# Patient Record
Sex: Female | Born: 2000 | Race: Asian | Hispanic: No | Marital: Single | Smoking: Never smoker
Health system: Southern US, Community
[De-identification: ages and names within clinical notes are randomized; demographics above are authoritative.]

## PROBLEM LIST (undated history)

## (undated) DIAGNOSIS — F319 Bipolar disorder, unspecified: Secondary | ICD-10-CM

---
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
## 2024-07-14 ENCOUNTER — Ambulatory Visit: Admission: EM | Admit: 2024-07-14 | Discharge: 2024-07-14 | Disposition: A | Discharge status: Home / Self Care

## 2024-07-14 ENCOUNTER — Encounter: Payer: Self-pay | Admitting: *Deleted

## 2024-07-14 DIAGNOSIS — R197 Diarrhea, unspecified: Secondary | ICD-10-CM

## 2024-07-14 DIAGNOSIS — R112 Nausea with vomiting, unspecified: Secondary | ICD-10-CM

## 2024-07-14 HISTORY — DX: Bipolar disorder, unspecified: F31.9

## 2024-07-14 MED ORDER — LOPERAMIDE HCL 2 MG PO CAPS: 2.0000 mg | ORAL_CAPSULE | Freq: Four times a day (QID) | ORAL | 0 refills | Status: AC | PRN

## 2024-07-14 MED ORDER — ONDANSETRON 4 MG PO TBDP: 4.0000 mg | ORAL_TABLET | Freq: Three times a day (TID) | ORAL | 0 refills | Status: AC | PRN

## 2024-07-14 NOTE — ED Provider Notes (Signed)
 " [facility]    CSN: [id number] Arrival date & time: 07/14/24  1736      History   Chief Complaint Chief Complaint  Patient presents with   Emesis   Diarrhea    HPI Toni Watts is a 23 y.o. female presenting w viral syndrome.  Pt reports n/v/d, onset today. Mild epigastric pain. No meds tried. Last emesis was this morning, 2-3 episodes, only mild nausea persists. States having diarrhea throughout the day, estimates 2-3x an hour, denies bloody diarrhea, denies black or tarry stool. Denies known fever. States had stomach pain earlier but none now. She is tolerating fluids po.  Has not attempted interventions at home.  Pt suspects food poisoning from company function (pink chicken). Denies recent travel Denies urinary symptoms. No prior history abd procedures.   HPI  Past Medical History:  Diagnosis Date   Bipolar 1 disorder [facility])     Patient Active Problem List   Diagnosis Date Noted   Hashimoto's thyroiditis 01/29/2017   Thyroid  goiter 09/13/2016    No past surgical history on file.  OB History   No obstetric history on file.      Home Medications    Prior to Admission medications  Medication Sig Start Date End Date Taking? Authorizing Provider  ARIPiprazole (ABILIFY) 5 MG tablet Take 5 mg by mouth daily. 06/30/24  Yes [provider]  escitalopram (LEXAPRO) 20 MG tablet Take 20 mg by mouth daily.   Yes [provider]  loperamide  (IMODIUM ) 2 MG capsule Take 1 capsule (2 mg total) by mouth 4 (four) times daily as needed for diarrhea or loose stools. 07/14/24  Yes Nalu Troublefield E, PA-C  ondansetron  (ZOFRAN -ODT) 4 MG disintegrating tablet Take 1 tablet (4 mg total) by mouth every 8 (eight) hours as needed for nausea or vomiting. 07/14/24  Yes Arlyss Leita BRAVO, PA-C  Vitamin D, Ergocalciferol, (DRISDOL) 1.25 MG (50000 UNIT) CAPS capsule Take 50,000 Units by mouth every 7 (seven) days. 09/19/23  Yes [provider]    Family  History No family history on file.  Social History Social History[1]   Allergies   Patient has no known allergies.   Review of Systems Review of Systems  Constitutional:  Negative for appetite change, chills and fever.  HENT:  Negative for congestion, ear pain, rhinorrhea, sinus pressure, sinus pain and sore throat.   Eyes:  Negative for redness and visual disturbance.  Respiratory:  Negative for cough, chest tightness, shortness of breath and wheezing.   Cardiovascular:  Negative for chest pain and palpitations.  Gastrointestinal:  Positive for diarrhea, nausea and vomiting. Negative for abdominal pain and constipation.  Genitourinary:  Negative for dysuria, frequency and urgency.  Musculoskeletal:  Negative for myalgias.  Neurological:  Negative for dizziness, weakness and headaches.  Psychiatric/Behavioral:  Negative for confusion.   All other systems reviewed and are negative.    Physical Exam Triage Vital Signs ED Triage Vitals [07/14/24 1814]  Encounter Vitals Group     BP      Girls Systolic BP Percentile      Girls Diastolic BP Percentile      Boys Systolic BP Percentile      Boys Diastolic BP Percentile      Pulse      Resp      Temp      Temp src      SpO2      Weight      Height      Head  Circumference      Peak Flow      Pain Score 0     Pain Loc      Pain Education      Exclude from Growth Chart    No data found.  Updated Vital Signs BP 111/72 (BP Location: Right Arm)   Pulse (!) 103   Temp 99 F (37.2 C) (Oral)   Resp 18   LMP 07/12/2024 (Approximate)   SpO2 96%   Visual Acuity Right Eye Distance:   Left Eye Distance:   Bilateral Distance:    Right Eye Near:   Left Eye Near:    Bilateral Near:     Physical Exam Vitals reviewed.  Constitutional:      General: She is not in acute distress.    Appearance: Normal appearance. She is not ill-appearing.  HENT:     Head: Normocephalic and atraumatic.     Mouth/Throat:     Mouth:  Mucous membranes are moist.     Comments: Moist mucous membranes Eyes:     Extraocular Movements: Extraocular movements intact.     Pupils: Pupils are equal, round, and reactive to light.  Cardiovascular:     Rate and Rhythm: Normal rate and regular rhythm.     Heart sounds: Normal heart sounds.  Pulmonary:     Effort: Pulmonary effort is normal.     Breath sounds: Normal breath sounds. No wheezing, rhonchi or rales.  Abdominal:     General: Bowel sounds are increased. There is no distension.     Palpations: Abdomen is soft. There is no mass.     Tenderness: There is abdominal tenderness in the epigastric area. There is no right CVA tenderness, left CVA tenderness, guarding or rebound. Negative signs include Murphy's sign, Rovsing's sign and McBurney's sign.     Comments: BS increased throughout. Mild epigastric pain to palpation without guarding or rebound.  Skin:    General: Skin is warm.     Capillary Refill: Capillary refill takes less than 2 seconds.     Comments: Good skin turgor  Neurological:     General: No focal deficit present.     Mental Status: She is alert and oriented to person, place, and time.  Psychiatric:        Mood and Affect: Mood normal.        Behavior: Behavior normal.      UC Treatments / Results  Labs (all labs ordered are listed, but only abnormal results are displayed) Labs Reviewed - No data to display  EKG   Radiology No results found.  Procedures Procedures (including critical care time)  Medications Ordered in UC Medications - No data to display  Initial Impression / Assessment and Plan / UC Course  I have reviewed the triage vital signs and the nursing notes.  Pertinent labs & imaging results that were available during my care of the patient were reviewed by me and considered in my medical decision making (see chart for details).     Patient is a pleasant 23 y.o. female presenting with nausea, vomiting, and diarrhea.  Suspect  food poisoning.. The patient is afebrile and nontachycardic.  Antipyretic has not been administered today. LMP 07/12/24.  She is tolerating fluids by mouth.  Zofran  sent.  Imodium  sent.  Good hydration, BRAT diet.  Return precautions as below.  Final Clinical Impressions(s) / UC Diagnoses   Final diagnoses:  Nausea vomiting and diarrhea     Discharge Instructions      -  Take the Zofran  (ondansetron ) up to 3 times daily for nausea and vomiting. Dissolve one pill under your tongue or between your teeth and your cheek. -You can purchase Imodium  (loperamide ) over-the-counter. Take the Imodium  (loperamide ) up to 4 times daily for diarrhea. -Drink plenty of fluids and eat a bland diet as tolerated -Follow-up if new/worsening symptoms, including worsening abdominal pain, new fevers, etc     ED Prescriptions     Medication Sig Dispense Auth. Provider   ondansetron  (ZOFRAN -ODT) 4 MG disintegrating tablet Take 1 tablet (4 mg total) by mouth every 8 (eight) hours as needed for nausea or vomiting. 20 tablet Casaundra Takacs E, PA-C   loperamide  (IMODIUM ) 2 MG capsule Take 1 capsule (2 mg total) by mouth 4 (four) times daily as needed for diarrhea or loose stools. 15 capsule Octavie Westerhold E, PA-C      PDMP not reviewed this encounter.     [1]  Social History Tobacco Use   Smoking status: Never   Smokeless tobacco: Never  Vaping Use   Vaping status: Every Day  Substance Use Topics   Alcohol use: No   Drug use: Never     Arlyss Leita BRAVO, PA-C 07/14/24 1847  "

## 2024-07-14 NOTE — ED Triage Notes (Signed)
 Pt reports n/v/d, onset today. No meds tried. Last emesis was this morning. States having diarrhea throughout the day. Denies known fever. States had stomach pain earlier but none now

## 2024-07-14 NOTE — Discharge Instructions (Addendum)
-  Take the Zofran  (ondansetron ) up to 3 times daily for nausea and vomiting. Dissolve one pill under your tongue or between your teeth and your cheek. -You can purchase Imodium  (loperamide ) over-the-counter. Take the Imodium  (loperamide ) up to 4 times daily for diarrhea. -Drink plenty of fluids and eat a bland diet as tolerated -Follow-up if new/worsening symptoms, including worsening abdominal pain, new fevers, etc

## 2024-08-20 ENCOUNTER — Encounter: Payer: Self-pay | Admitting: Emergency Medicine

## 2024-08-20 ENCOUNTER — Ambulatory Visit: Admission: EM | Admit: 2024-08-20 | Discharge: 2024-08-20 | Disposition: A | Discharge status: Home / Self Care

## 2024-08-20 DIAGNOSIS — J101 Influenza due to other identified influenza virus with other respiratory manifestations: Secondary | ICD-10-CM

## 2024-08-20 DIAGNOSIS — J069 Acute upper respiratory infection, unspecified: Secondary | ICD-10-CM

## 2024-08-20 LAB — POCT INFLUENZA A/B
Influenza A, POC: POSITIVE — AB
Influenza B, POC: NEGATIVE

## 2024-08-20 MED ORDER — OSELTAMIVIR PHOSPHATE 75 MG PO CAPS: 75.0000 mg | ORAL_CAPSULE | Freq: Two times a day (BID) | ORAL | 0 refills | Status: AC

## 2024-08-20 MED ORDER — BENZONATATE 100 MG PO CAPS: 100.0000 mg | ORAL_CAPSULE | Freq: Three times a day (TID) | ORAL | 0 refills | Status: AC

## 2024-08-20 NOTE — ED Triage Notes (Signed)
 Pt presents c/o URI x 2 days. Pt states,  I have fever , cough, and runny nose. I think I have a mild cold and I need a dr's note for work.  Pt denies emesis and diarrhea.

## 2024-08-20 NOTE — ED Provider Notes (Signed)
 " [facility]    CSN: [id number] Arrival date & time: 08/20/24  1117      History   Chief Complaint Chief Complaint  Patient presents with   URI    HPI Toni Watts is a 24 y.o. female.   Pt presents today due to 2 days of subjective fever, coughing, and sneezing. Pt states that she has been using OTC cold medicines with +/- relief of symptoms.   The history is provided by the patient.  URI   Past Medical History:  Diagnosis Date   Bipolar 1 disorder [facility])     Patient Active Problem List   Diagnosis Date Noted   Hashimoto's thyroiditis 01/29/2017   Thyroid  goiter 09/13/2016    History reviewed. No pertinent surgical history.  OB History   No obstetric history on file.      Home Medications    Prior to Admission medications  Medication Sig Start Date End Date Taking? Authorizing Provider  benzonatate  (TESSALON ) 100 MG capsule Take 1 capsule (100 mg total) by mouth every 8 (eight) hours. 08/20/24  Yes Andra Krabbe C, PA-C  melatonin 3 MG TABS tablet Take 3 mg by mouth. 06/07/20  Yes [provider]  oseltamivir  (TAMIFLU ) 75 MG capsule Take 1 capsule (75 mg total) by mouth every 12 (twelve) hours. 08/20/24  Yes Andra Krabbe BROCKS, PA-C  OVER THE COUNTER MEDICATION DayQuil   Yes [provider]  ARIPiprazole (ABILIFY) 2 MG tablet Take 2 mg by mouth daily.    [provider]  ARIPiprazole (ABILIFY) 5 MG tablet Take 5 mg by mouth daily. 06/30/24   [provider]  escitalopram (LEXAPRO) 20 MG tablet Take 20 mg by mouth daily.    [provider]  loperamide  (IMODIUM ) 2 MG capsule Take 1 capsule (2 mg total) by mouth 4 (four) times daily as needed for diarrhea or loose stools. 07/14/24   Graham, Laura E, PA-C  ondansetron  (ZOFRAN -ODT) 4 MG disintegrating tablet Take 1 tablet (4 mg total) by mouth every 8 (eight) hours as needed for nausea or vomiting. 07/14/24   Graham, Laura E, PA-C  Vitamin D,  Ergocalciferol, (DRISDOL) 1.25 MG (50000 UNIT) CAPS capsule Take 50,000 Units by mouth every 7 (seven) days. 09/19/23   [provider]    Family History Family History  Problem Relation Age of Onset   Healthy Mother    Healthy Father     Social History Social History[1]   Allergies   Patient has no known allergies.   Review of Systems Review of Systems   Physical Exam Triage Vital Signs ED Triage Vitals  Encounter Vitals Group     BP 08/20/24 1129 111/73     Girls Systolic BP Percentile --      Girls Diastolic BP Percentile --      Boys Systolic BP Percentile --      Boys Diastolic BP Percentile --      Pulse Rate 08/20/24 1129 (!) 113     Resp 08/20/24 1129 18     Temp 08/20/24 1129 100 F (37.8 C)     Temp Source 08/20/24 1129 Oral     SpO2 08/20/24 1129 96 %     Weight 08/20/24 1128 109 lb 2 oz (49.5 kg)     Height --      Head Circumference --      Peak Flow --      Pain Score 08/20/24 1127 6     Pain Loc --  Pain Education --      Exclude from Growth Chart --    No data found.  Updated Vital Signs BP 111/73 (BP Location: Left Arm)   Pulse (!) 113   Temp 100 F (37.8 C) (Oral)   Resp 18   Wt 109 lb 2 oz (49.5 kg)   LMP 08/06/2024 (Exact Date)   SpO2 96%   Visual Acuity Right Eye Distance:   Left Eye Distance:   Bilateral Distance:    Right Eye Near:   Left Eye Near:    Bilateral Near:     Physical Exam Vitals and nursing note reviewed.  Constitutional:      General: She is not in acute distress.    Appearance: Normal appearance. She is not ill-appearing, toxic-appearing or diaphoretic.  HENT:     Nose: Congestion (markedly enlarged turbinates) present. No rhinorrhea.     Mouth/Throat:     Mouth: Mucous membranes are moist.     Pharynx: Oropharynx is clear. No oropharyngeal exudate or posterior oropharyngeal erythema.  Eyes:     General: No scleral icterus. Cardiovascular:     Rate and Rhythm: Normal rate and regular  rhythm.     Heart sounds: Normal heart sounds.  Pulmonary:     Effort: Pulmonary effort is normal. No respiratory distress.     Breath sounds: Normal breath sounds. No wheezing or rhonchi.  Skin:    General: Skin is warm.  Neurological:     Mental Status: She is alert and oriented to person, place, and time.  Psychiatric:        Mood and Affect: Mood normal.        Behavior: Behavior normal.      UC Treatments / Results  Labs (all labs ordered are listed, but only abnormal results are displayed) Labs Reviewed  POCT INFLUENZA A/B - Abnormal; Notable for the following components:      Result Value   Influenza A, POC Positive (*)    All other components within normal limits    EKG   Radiology No results found.  Procedures Procedures (including critical care time)  Medications Ordered in UC Medications - No data to display  Initial Impression / Assessment and Plan / UC Course  I have reviewed the triage vital signs and the nursing notes.  Pertinent labs & imaging results that were available during my care of the patient were reviewed by me and considered in my medical decision making (see chart for details).     Final Clinical Impressions(s) / UC Diagnoses   Final diagnoses:  Viral URI  Influenza A     Discharge Instructions      You are positive for flu A. Will send Tamiflu  and tessalon  for symptoms.     ED Prescriptions     Medication Sig Dispense Auth. Provider   benzonatate  (TESSALON ) 100 MG capsule Take 1 capsule (100 mg total) by mouth every 8 (eight) hours. 30 capsule Andra Krabbe C, PA-C   oseltamivir  (TAMIFLU ) 75 MG capsule Take 1 capsule (75 mg total) by mouth every 12 (twelve) hours. 10 capsule Andra Krabbe BROCKS, PA-C      PDMP not reviewed this encounter.    [1]  Social History Tobacco Use   Smoking status: Never    Passive exposure: Never   Smokeless tobacco: Never  Vaping Use   Vaping status: Former  Substance Use  Topics   Alcohol use: Yes    Comment: Occassionally   Drug use: Never  Andra Corean BROCKS, PA-C 08/20/24 1156  "

## 2024-08-20 NOTE — Discharge Instructions (Addendum)
 You are positive for flu A. Will send Tamiflu  and tessalon  for symptoms.
